# Patient Record
Sex: Male | Born: 2000 | Race: White | Hispanic: No | Marital: Single | Smoking: Current every day smoker
Health system: Southern US, Community
[De-identification: ages and names within clinical notes are randomized; demographics above are authoritative.]

---
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
## 2018-05-24 IMAGING — CR DG CHEST 2V
1 series · 2 of 2 positions shown · non-contrast
Comparison: None.

CLINICAL DATA: Cough.  Fever.  Diarrhea.

EXAM:
CHEST  2 VIEW

[Series 1: dg chest 2 view · 0.14mm/px · 2 of 2 slices shown]
[im 1/2]
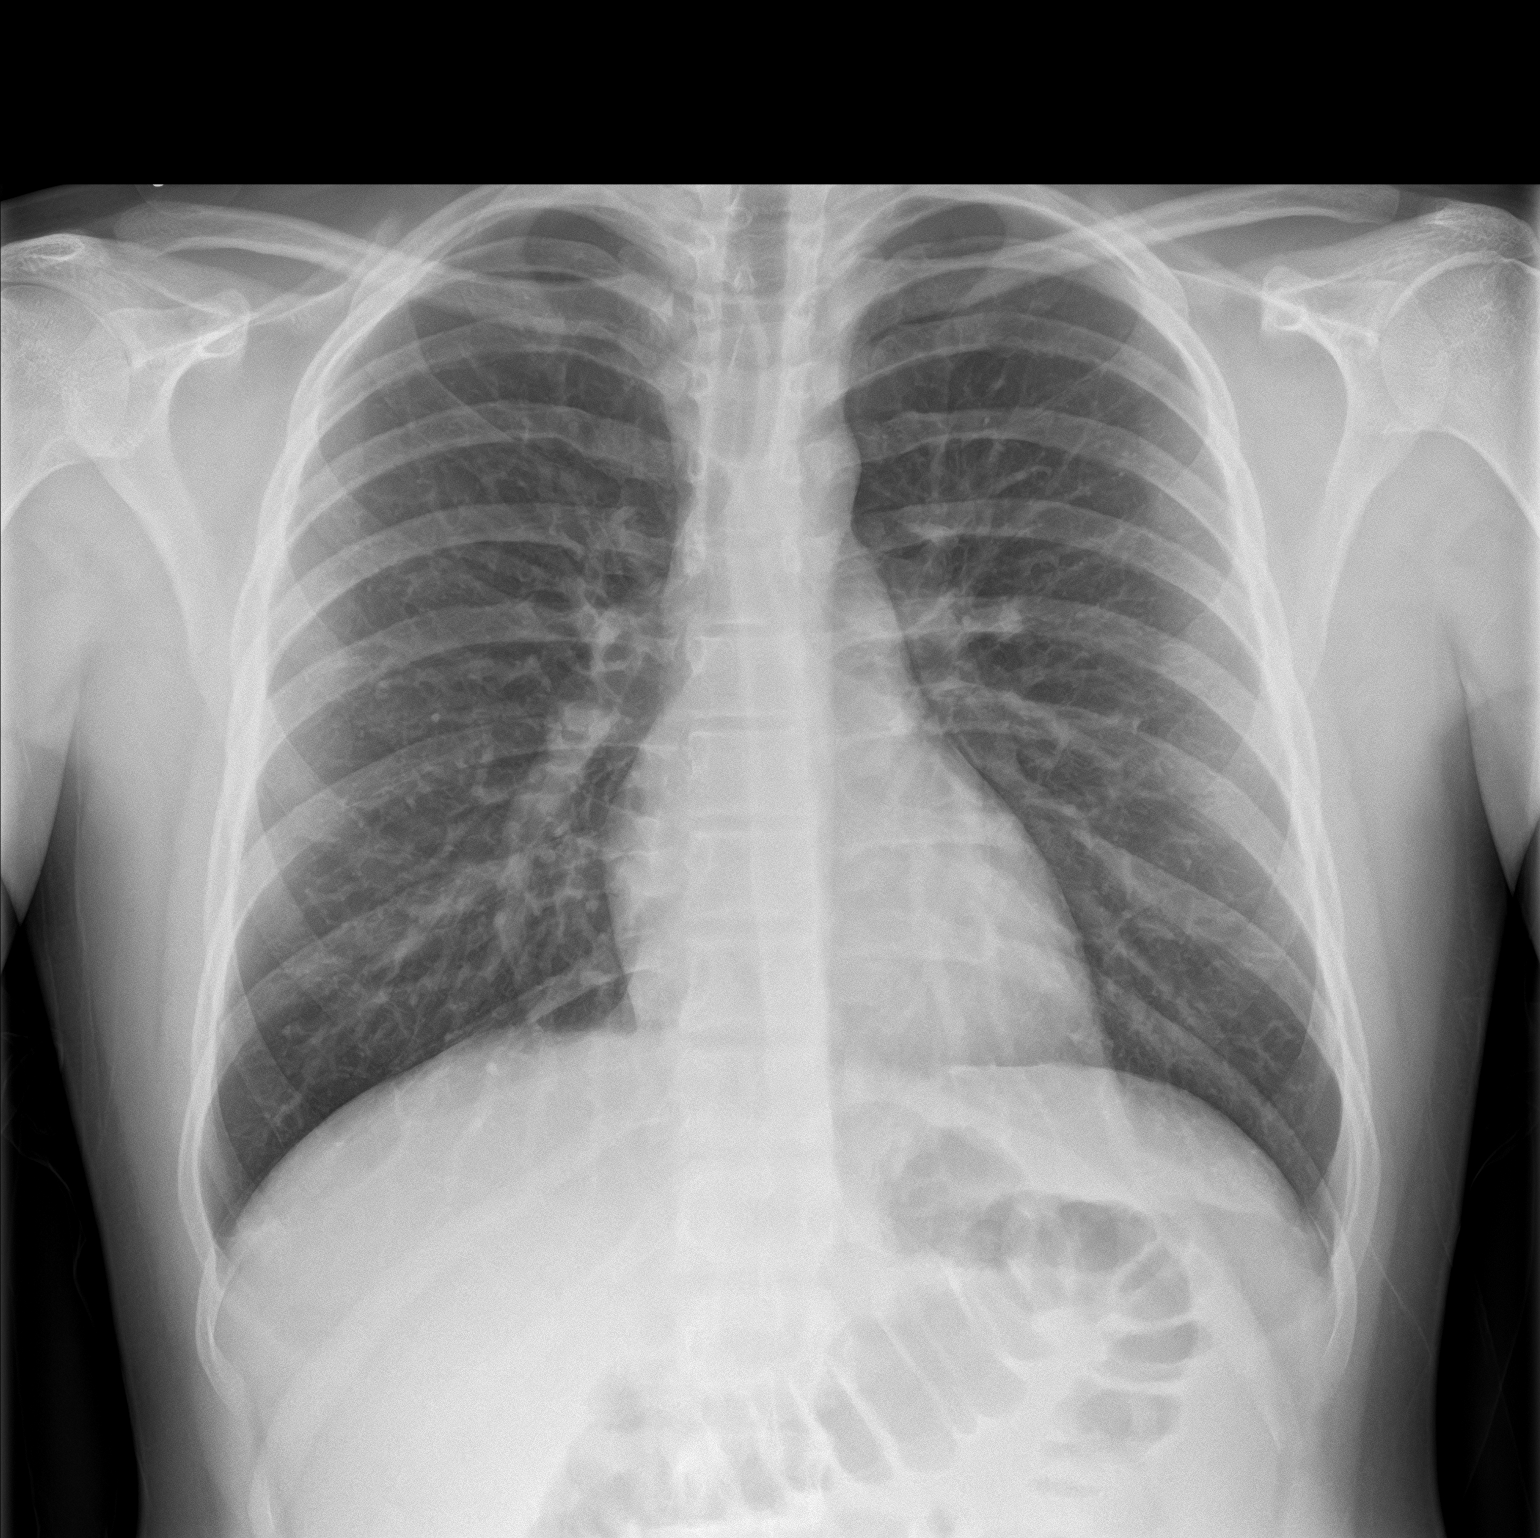
[im 2/2]
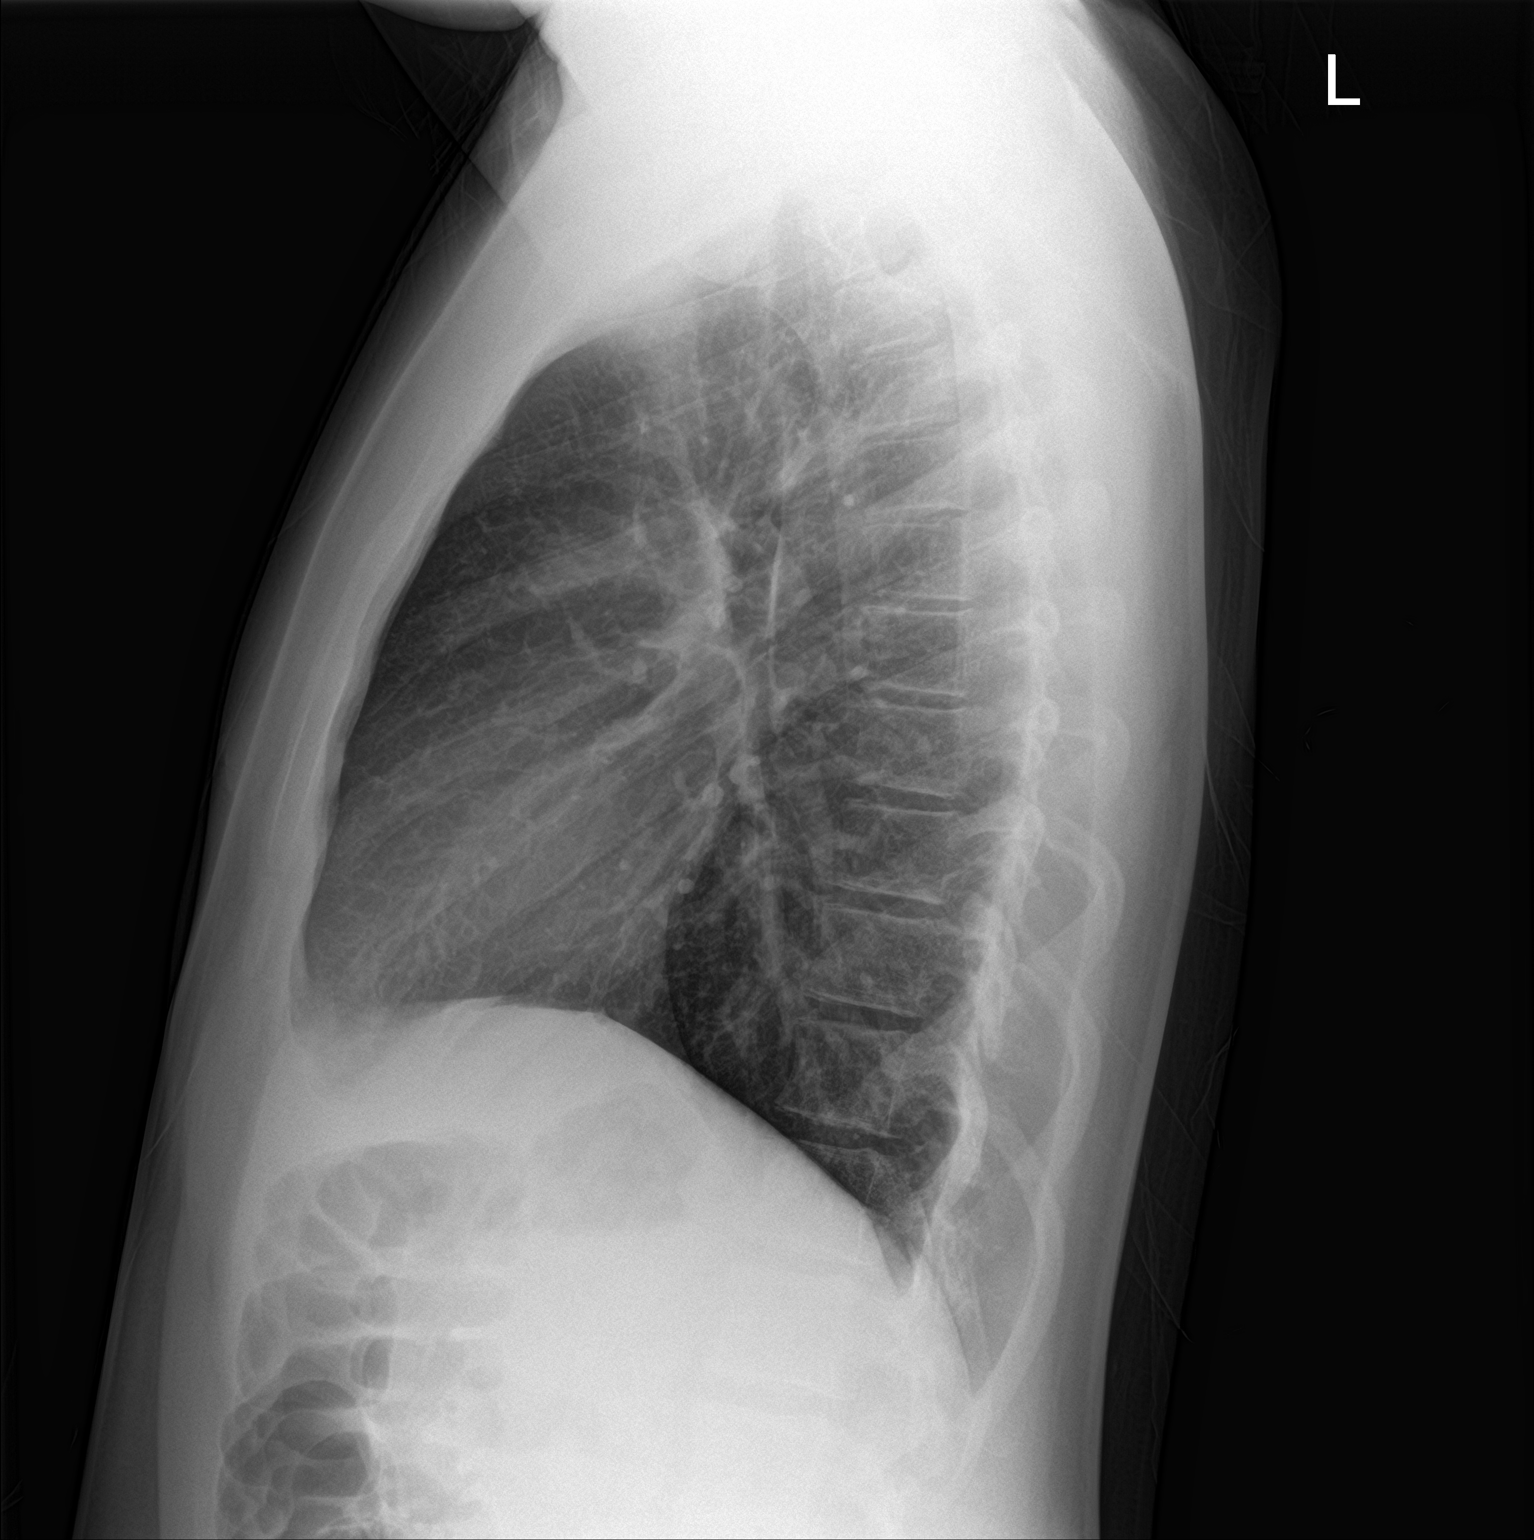

[2 of 2 positions shown; findings below may reference images not displayed]

FINDINGS: Normal cardiac and mediastinal contours. No consolidative pulmonary
opacities. No pleural effusion or pneumothorax. Regional skeleton is
unremarkable.
IMPRESSION: No acute cardiopulmonary process.
